# Patient Record
Sex: Female | Born: 1970 | Race: White | Hispanic: No | Marital: Married | Smoking: Never smoker
Health system: Southern US, Community
[De-identification: ages and names within clinical notes are randomized; demographics above are authoritative.]

## PROBLEM LIST (undated history)

## (undated) DIAGNOSIS — E079 Disorder of thyroid, unspecified: Secondary | ICD-10-CM

---

## 2016-05-31 ENCOUNTER — Emergency Department (HOSPITAL_COMMUNITY)
Admission: EM | Admit: 2016-05-31 | Discharge: 2016-06-01 | Disposition: A | Discharge status: Home / Self Care | Payer: Self-pay | Attending: Emergency Medicine | Admitting: Emergency Medicine

## 2016-05-31 ENCOUNTER — Emergency Department (HOSPITAL_COMMUNITY): Payer: Self-pay

## 2016-05-31 ENCOUNTER — Encounter (HOSPITAL_COMMUNITY): Payer: Self-pay

## 2016-05-31 DIAGNOSIS — R079 Chest pain, unspecified: Secondary | ICD-10-CM | POA: Insufficient documentation

## 2016-05-31 DIAGNOSIS — Z5321 Procedure and treatment not carried out due to patient leaving prior to being seen by health care provider: Secondary | ICD-10-CM | POA: Insufficient documentation

## 2016-05-31 HISTORY — DX: Disorder of thyroid, unspecified: E07.9

## 2016-05-31 LAB — BASIC METABOLIC PANEL
Anion gap: 10 (ref 5–15)
BUN: 15 mg/dL (ref 6–20)
CHLORIDE: 103 mmol/L (ref 101–111)
CO2: 25 mmol/L (ref 22–32)
CREATININE: 0.58 mg/dL (ref 0.44–1.00)
Calcium: 9.5 mg/dL (ref 8.9–10.3)
Glucose, Bld: 90 mg/dL (ref 65–99)
Potassium: 3.4 mmol/L — ABNORMAL LOW (ref 3.5–5.1)
SODIUM: 138 mmol/L (ref 135–145)

## 2016-05-31 LAB — CBC
HCT: 35.1 % — ABNORMAL LOW (ref 36.0–46.0)
Hemoglobin: 11.9 g/dL — ABNORMAL LOW (ref 12.0–15.0)
MCH: 34.3 pg — AB (ref 26.0–34.0)
MCHC: 33.9 g/dL (ref 30.0–36.0)
MCV: 101.2 fL — AB (ref 78.0–100.0)
Platelets: 267 10*3/uL (ref 150–400)
RBC: 3.47 MIL/uL — AB (ref 3.87–5.11)
RDW: 12.8 % (ref 11.5–15.5)
WBC: 8.6 10*3/uL (ref 4.0–10.5)

## 2016-05-31 NOTE — ED Triage Notes (Signed)
Pt complains of right sided chest pain underneath her breast that started about 3am, was unable to get a deep breath at that time, it's a little better now but still uncomfortable

## 2016-06-01 LAB — I-STAT TROPONIN, ED: Troponin i, poc: 0 ng/mL (ref 0.00–0.08)

## 2016-06-01 NOTE — ED Notes (Signed)
Registration stated that pt turned in their stickers and told them she is leaving

## 2018-03-15 IMAGING — CR DG CHEST 2V
2 series · 2 of 2 positions shown · non-contrast
Comparison: None.

CLINICAL DATA: Intermittent right-sided chest pain

EXAM:
CHEST  2 VIEW

[w chest pa]
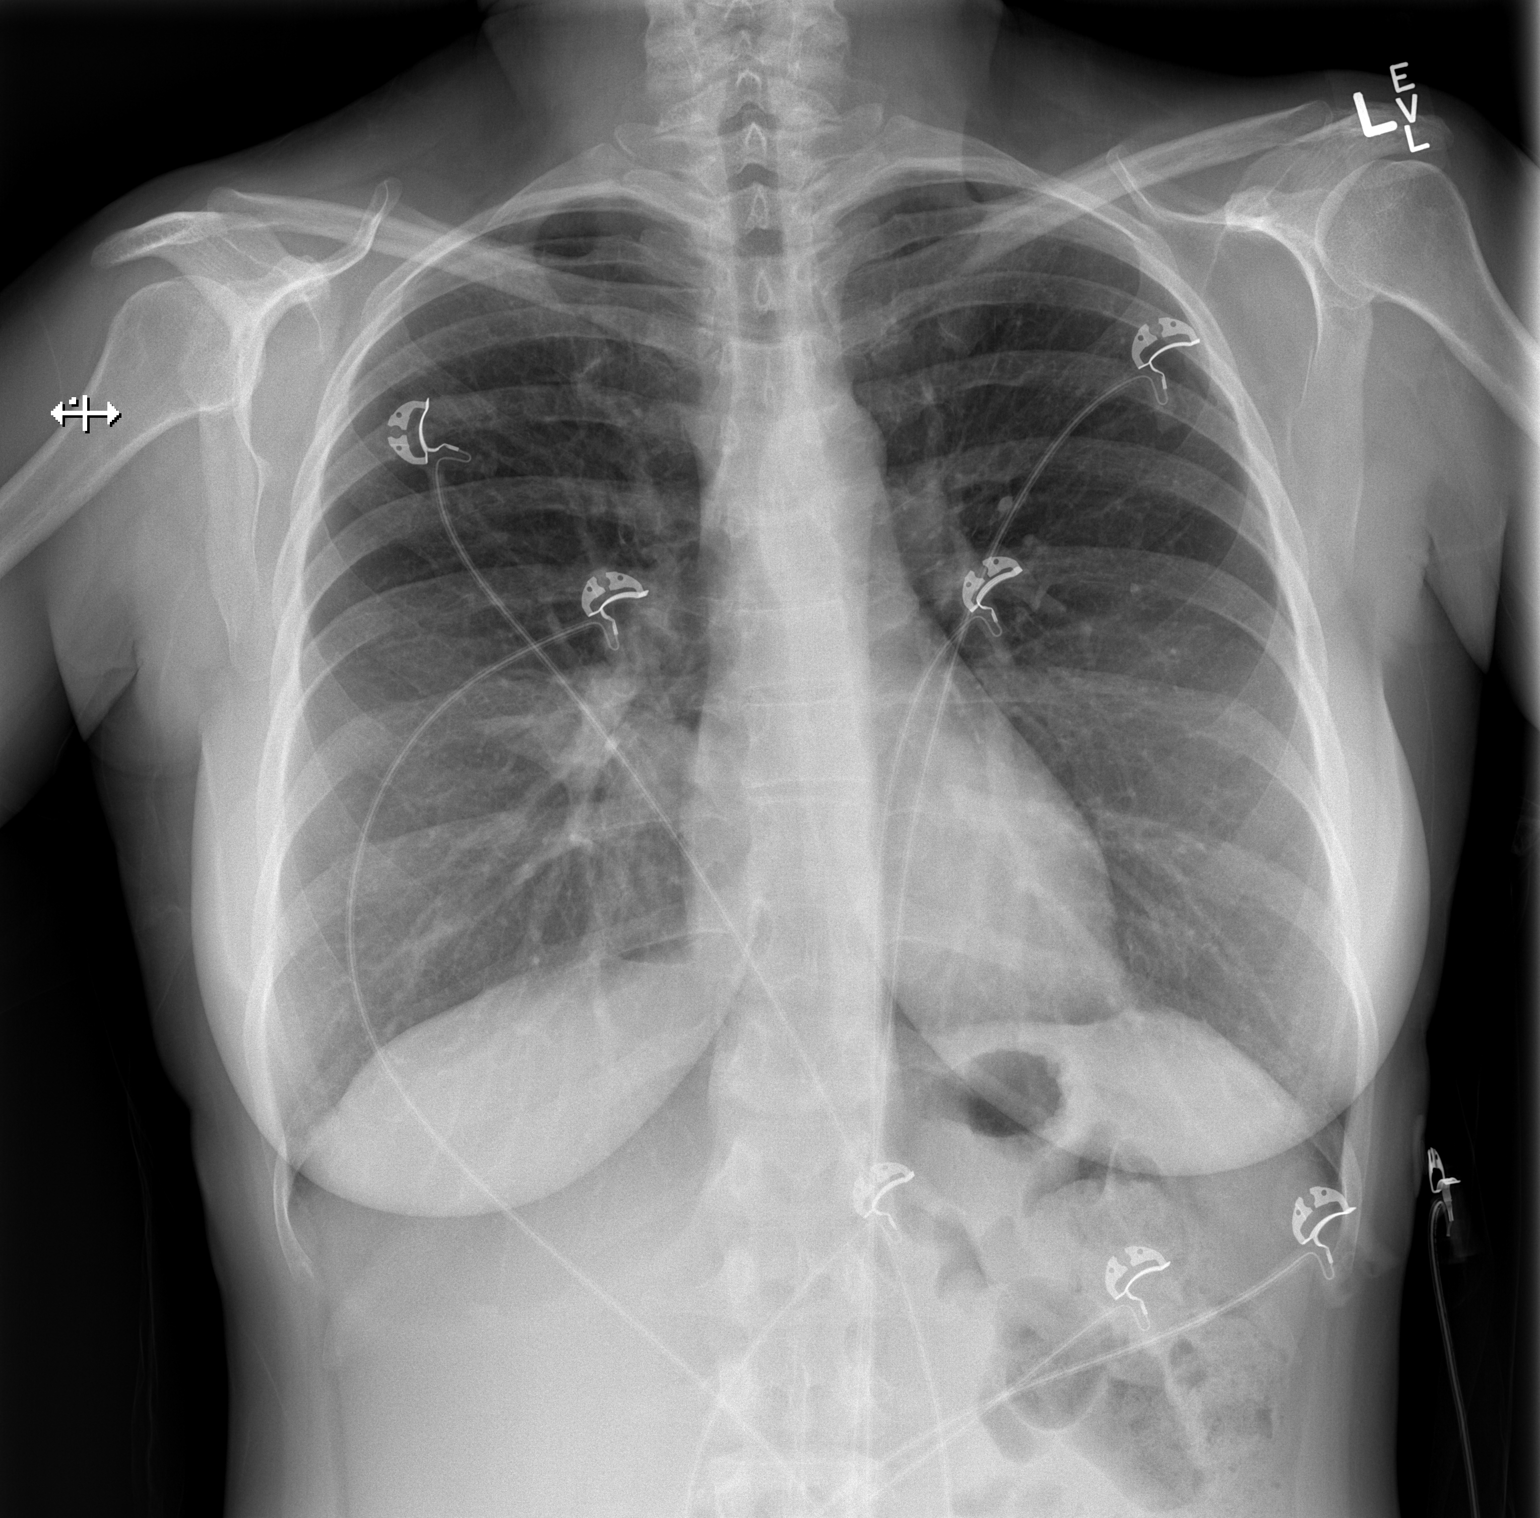

[w chest lat]
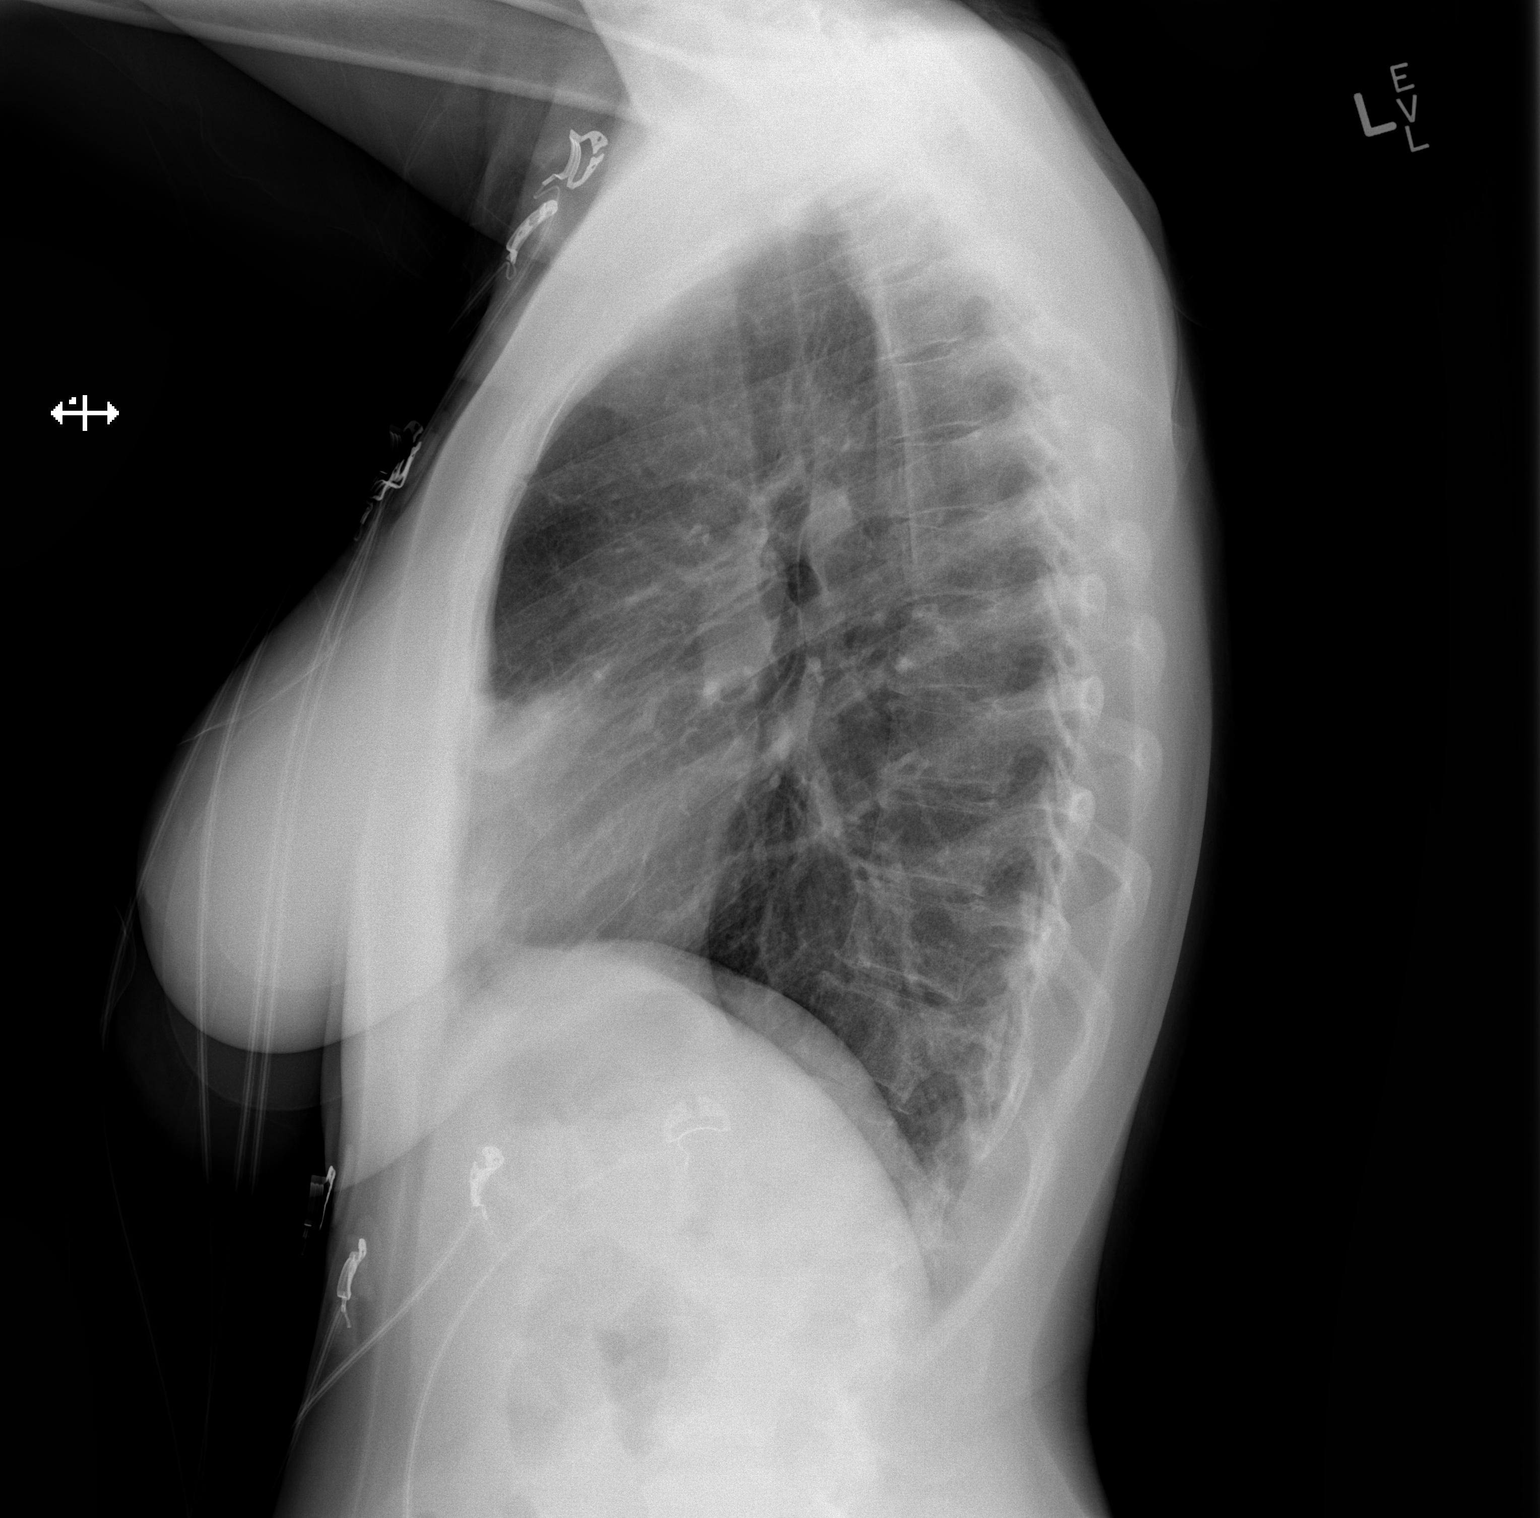

[2 of 2 positions shown; findings below may reference images not displayed]

FINDINGS: The heart size and mediastinal contours are within normal limits.
Airspace opacity in the anterior right middle lobe distribution
suspicious for possible pneumonia. Left lung is clear. The
visualized skeletal structures are unremarkable.
IMPRESSION: Subtle opacity in the right middle lobe distribution anteriorly may
reflect a small focus of pneumonia. Followup PA and lateral chest
X-ray is recommended in 3-4 weeks following trial of antibiotic
therapy to ensure resolution.

## 2023-07-12 ENCOUNTER — Encounter: Payer: Self-pay | Admitting: Obstetrics & Gynecology

## 2023-07-12 ENCOUNTER — Ambulatory Visit: Payer: Self-pay | Admitting: Obstetrics & Gynecology

## 2023-07-12 ENCOUNTER — Other Ambulatory Visit (HOSPITAL_COMMUNITY)
Admission: RE | Admit: 2023-07-12 | Discharge: 2023-07-12 | Disposition: A | Discharge status: Home / Self Care | Source: Ambulatory Visit | Attending: Obstetrics & Gynecology | Admitting: Obstetrics & Gynecology

## 2023-07-12 VITALS — BP 121/81 | HR 80 | Ht 60.0 in | Wt 122.5 lb

## 2023-07-12 DIAGNOSIS — N912 Amenorrhea, unspecified: Secondary | ICD-10-CM | POA: Diagnosis not present

## 2023-07-12 DIAGNOSIS — Z1151 Encounter for screening for human papillomavirus (HPV): Secondary | ICD-10-CM | POA: Diagnosis not present

## 2023-07-12 DIAGNOSIS — Z1231 Encounter for screening mammogram for malignant neoplasm of breast: Secondary | ICD-10-CM

## 2023-07-12 DIAGNOSIS — Z01419 Encounter for gynecological examination (general) (routine) without abnormal findings: Secondary | ICD-10-CM | POA: Diagnosis not present

## 2023-07-12 NOTE — Progress Notes (Signed)
 Subjective:     Candice Bell is a 53 y.o. female here for a routine exam.  No LMP recorded. (Menstrual status: IUD). [id number] Birth Control Method:  Mirena IUD, removed today Menstrual Calendar(currently): amenorrhea with Mirena  Current complaints: none.   Current acute medical issues:  none   Recent Gynecologic History No LMP recorded. (Menstrual status: IUD). Last Pap: 2018ish,  normal Last mammogram: 2018,  normal  Past Medical History:  Diagnosis Date   Thyroid disease     History reviewed. No pertinent surgical history.  OB History     Gravida  2   Para  2   Term  2   Preterm      AB      Living  2      SAB      IAB      Ectopic      Multiple      Live Births  2           Social History   Socioeconomic History   Marital status: Married    Spouse name: Not on file   Number of children: Not on file   Years of education: Not on file   Highest education level: Not on file  Occupational History   Not on file  Tobacco Use   Smoking status: Never   Smokeless tobacco: Never  Vaping Use   Vaping status: Never Used  Substance and Sexual Activity   Alcohol use: Not Currently    Comment: occasionaly   Drug use: Never   Sexual activity: Yes    Birth control/protection: I.U.D.  Other Topics Concern   Not on file  Social History Narrative   Not on file   Social Drivers of Health   Financial Resource Strain: Low Risk  (07/12/2023)   Overall Financial Resource Strain (CARDIA)    Difficulty of Paying Living Expenses: Not hard at all  Food Insecurity: No Food Insecurity (07/12/2023)   Hunger Vital Sign    Worried About Running Out of Food in the Last Year: Never true    Ran Out of Food in the Last Year: Never true  Transportation Needs: No Transportation Needs (07/12/2023)   PRAPARE - Administrator, Civil Service (Medical): No    Lack of Transportation (Non-Medical): No  Physical Activity: Sufficiently Active (07/12/2023)    Exercise Vital Sign    Days of Exercise per Week: 6 days    Minutes of Exercise per Session: 30 min  Stress: No Stress Concern Present (07/12/2023)   [employer] of Occupational Health - Occupational Stress Questionnaire    Feeling of Stress : Only a little  Social Connections: Socially Integrated (07/12/2023)   Social Connection and Isolation Panel [NHANES]    Frequency of Communication with Friends and Family: More than three times a week    Frequency of Social Gatherings with Friends and Family: Three times a week    Attends Religious Services: 1 to 4 times per year    Active Member of Clubs or Organizations: Yes    Attends Banker Meetings: More than 4 times per year    Marital Status: Married    History reviewed. No pertinent family history.  No current outpatient medications on file.  Review of Systems  Review of Systems  Constitutional: Negative for fever, chills, weight loss, malaise/fatigue and diaphoresis.  HENT: Negative for hearing loss, ear pain, nosebleeds, congestion, sore throat, neck pain, tinnitus and ear discharge.  Eyes: Negative for blurred vision, double vision, photophobia, pain, discharge and redness.  Respiratory: Negative for cough, hemoptysis, sputum production, shortness of breath, wheezing and stridor.   Cardiovascular: Negative for chest pain, palpitations, orthopnea, claudication, leg swelling and PND.  Gastrointestinal: negative for abdominal pain. Negative for heartburn, nausea, vomiting, diarrhea, constipation, blood in stool and melena.  Genitourinary: Negative for dysuria, urgency, frequency, hematuria and flank pain.  Musculoskeletal: Negative for myalgias, back pain, joint pain and falls.  Skin: Negative for itching and rash.  Neurological: Negative for dizziness, tingling, tremors, sensory change, speech change, focal weakness, seizures, loss of consciousness, weakness and headaches.  Endo/Heme/Allergies: Negative for  environmental allergies and polydipsia. Does not bruise/bleed easily.  Psychiatric/Behavioral: Negative for depression, suicidal ideas, hallucinations, memory loss and substance abuse. The patient is not nervous/anxious and does not have insomnia.        Objective:  Blood pressure 121/81, pulse 80, height 5' (1.524 m), weight 122 lb 8 oz (55.6 kg).   Physical Exam  Vitals reviewed. Constitutional: She is oriented to person, place, and time. She appears well-developed and well-nourished.  HENT:  Head: Normocephalic and atraumatic.        Right Ear: External ear normal.  Left Ear: External ear normal.  Nose: Nose normal.  Mouth/Throat: Oropharynx is clear and moist.  Eyes: Conjunctivae and EOM are normal. Pupils are equal, round, and reactive to light. Right eye exhibits no discharge. Left eye exhibits no discharge. No scleral icterus.  Neck: Normal range of motion. Neck supple. No tracheal deviation present. No thyromegaly present.  Cardiovascular: Normal rate, regular rhythm, normal heart sounds and intact distal pulses.  Exam reveals no gallop and no friction rub.   No murmur heard. Respiratory: Effort normal and breath sounds normal. No respiratory distress. She has no wheezes. She has no rales. She exhibits no tenderness.  GI: Soft. Bowel sounds are normal. She exhibits no distension and no mass. There is no tenderness. There is no rebound and no guarding.  Genitourinary:  Breasts no masses skin changes or nipple changes bilaterally      Vulva is normal without lesions Vagina is pink moist without discharge Cervix normal in appearance and pap is done Mirena IUD removed without difficulty Uterus is normal size shape and contour Adnexa is negative with normal sized ovaries   Musculoskeletal: Normal range of motion. She exhibits no edema and no tenderness.  Neurological: She is alert and oriented to person, place, and time. She has normal reflexes. She displays normal reflexes. No  cranial nerve deficit. She exhibits normal muscle tone. Coordination normal.  Skin: Skin is warm and dry. No rash noted. No erythema. No pallor.  Psychiatric: She has a normal mood and affect. Her behavior is normal. Judgment and thought content normal.       Medications Ordered at today's visit: No orders of the defined types were placed in this encounter.   Other orders placed at today's visit: Orders Placed This Encounter  Procedures   MM 3D SCREENING MAMMOGRAM BILATERAL BREAST     ASSESSMENT + PLAN:    ICD-10-CM   1. Well woman exam with routine gynecological exam  Z01.419     2. Screening for HPV (human papillomavirus)  Z11.51 Cytology - PAP    3. Breast cancer screening by mammogram  Z12.31 MM 3D SCREENING MAMMOGRAM BILATERAL BREAST    MM 3D SCREENING MAMMOGRAM BILATERAL BREAST    4. Amenorrhea, Mirena in place, likely menopausal  N91.2    Discussed [facility]  for HRT in a small framed healthy Caucasian woman          No follow-ups on file.

## 2023-07-17 ENCOUNTER — Ambulatory Visit: Payer: Self-pay | Admitting: Obstetrics & Gynecology

## 2023-07-17 LAB — CYTOLOGY - PAP
Comment: NEGATIVE
Diagnosis: UNDETERMINED — AB
High risk HPV: NEGATIVE

## 2023-08-13 ENCOUNTER — Other Ambulatory Visit: Payer: Self-pay | Admitting: Obstetrics & Gynecology

## 2023-08-13 ENCOUNTER — Ambulatory Visit (HOSPITAL_BASED_OUTPATIENT_CLINIC_OR_DEPARTMENT_OTHER)
Admission: RE | Admit: 2023-08-13 | Discharge: 2023-08-13 | Disposition: A | Discharge status: Home / Self Care | Source: Ambulatory Visit | Attending: Obstetrics & Gynecology | Admitting: Obstetrics & Gynecology

## 2023-08-13 DIAGNOSIS — Z1231 Encounter for screening mammogram for malignant neoplasm of breast: Secondary | ICD-10-CM

## 2023-08-13 DIAGNOSIS — N912 Amenorrhea, unspecified: Secondary | ICD-10-CM

## 2023-08-13 DIAGNOSIS — Z01419 Encounter for gynecological examination (general) (routine) without abnormal findings: Secondary | ICD-10-CM

## 2023-08-13 DIAGNOSIS — Z1151 Encounter for screening for human papillomavirus (HPV): Secondary | ICD-10-CM
